# Patient Record
Sex: Male | Born: 1975 | Race: White | Hispanic: No | Marital: Single | Smoking: Current every day smoker
Health system: Southern US, Community
[De-identification: ages and names within clinical notes are randomized; demographics above are authoritative.]

---

## 2013-03-01 ENCOUNTER — Emergency Department: Payer: Self-pay | Admitting: Emergency Medicine

## 2013-04-28 ENCOUNTER — Emergency Department: Payer: Self-pay | Admitting: Emergency Medicine

## 2013-04-28 LAB — ETHANOL
Ethanol %: <0.003 % (ref 0.000–0.080)
Ethanol: <3 mg/dL

## 2013-04-28 LAB — TSH: Thyroid Stimulating Horm: 0.94 u[IU]/mL

## 2013-04-28 LAB — CBC
HCT: 48.6 % (ref 40.0–52.0)
HGB: 16.4 g/dL (ref 13.0–18.0)
MCH: 31.5 pg (ref 26.0–34.0)
MCHC: 33.7 g/dL (ref 32.0–36.0)
Platelet: 409 10*3/uL (ref 150–440)
RDW: 14.6 % — ABNORMAL HIGH (ref 11.5–14.5)

## 2013-04-28 LAB — URINALYSIS, COMPLETE
Bacteria: NONE SEEN
Blood: NEGATIVE
Leukocyte Esterase: NEGATIVE
Nitrite: NEGATIVE
Ph: 8 (ref 4.5–8.0)
Protein: NEGATIVE
Specific Gravity: 1.012 (ref 1.003–1.030)
Squamous Epithelial: <1

## 2013-04-28 LAB — DRUG SCREEN, URINE
Amphetamines, Ur Screen: NEGATIVE (ref ?–1000)
Barbiturates, Ur Screen: NEGATIVE (ref ?–200)
Benzodiazepine, Ur Scrn: NEGATIVE (ref ?–200)
Cannabinoid 50 Ng, Ur ~~LOC~~: POSITIVE (ref ?–50)
Cocaine Metabolite,Ur ~~LOC~~: POSITIVE (ref ?–300)
MDMA (Ecstasy)Ur Screen: NEGATIVE (ref ?–500)
Opiate, Ur Screen: NEGATIVE (ref ?–300)

## 2013-04-28 LAB — COMPREHENSIVE METABOLIC PANEL
Albumin: 3.6 g/dL (ref 3.4–5.0)
Alkaline Phosphatase: 163 U/L — ABNORMAL HIGH (ref 50–136)
Anion Gap: 2 — ABNORMAL LOW (ref 7–16)
Bilirubin,Total: 0.3 mg/dL (ref 0.2–1.0)
Calcium, Total: 9.4 mg/dL (ref 8.5–10.1)
Chloride: 107 mmol/L (ref 98–107)
Creatinine: 0.98 mg/dL (ref 0.60–1.30)
EGFR (African American): >60
Glucose: 108 mg/dL — ABNORMAL HIGH (ref 65–99)
Osmolality: 276 (ref 275–301)
Potassium: 4.2 mmol/L (ref 3.5–5.1)
SGOT(AST): 18 U/L (ref 15–37)
SGPT (ALT): 27 U/L (ref 12–78)
Sodium: 139 mmol/L (ref 136–145)
Total Protein: 7.6 g/dL (ref 6.4–8.2)

## 2013-04-28 LAB — ACETAMINOPHEN LEVEL: Acetaminophen: <2 ug/mL

## 2013-04-28 LAB — SALICYLATE LEVEL: Salicylates, Serum: 3.5 mg/dL — ABNORMAL HIGH

## 2013-12-22 ENCOUNTER — Ambulatory Visit: Payer: Self-pay | Admitting: Internal Medicine

## 2014-01-11 ENCOUNTER — Inpatient Hospital Stay: Payer: Self-pay | Admitting: Cardiothoracic Surgery

## 2014-01-11 LAB — COMPREHENSIVE METABOLIC PANEL
ALBUMIN: 2.6 g/dL — AB (ref 3.4–5.0)
ALK PHOS: 110 U/L
ALT: 25 U/L (ref 12–78)
ANION GAP: 5 — AB (ref 7–16)
AST: 26 U/L (ref 15–37)
BUN: 10 mg/dL (ref 7–18)
Bilirubin,Total: 0.4 mg/dL (ref 0.2–1.0)
CHLORIDE: 105 mmol/L (ref 98–107)
CREATININE: 1.05 mg/dL (ref 0.60–1.30)
Calcium, Total: 8.3 mg/dL — ABNORMAL LOW (ref 8.5–10.1)
Co2: 30 mmol/L (ref 21–32)
GLUCOSE: 102 mg/dL — AB (ref 65–99)
Osmolality: 279 (ref 275–301)
Potassium: 4 mmol/L (ref 3.5–5.1)
Sodium: 140 mmol/L (ref 136–145)
TOTAL PROTEIN: 6.4 g/dL (ref 6.4–8.2)

## 2014-01-11 LAB — CBC
HCT: 38.4 % — ABNORMAL LOW (ref 40.0–52.0)
HGB: 12.6 g/dL — AB (ref 13.0–18.0)
MCH: 31.6 pg (ref 26.0–34.0)
MCHC: 32.9 g/dL (ref 32.0–36.0)
MCV: 96 fL (ref 80–100)
Platelet: 455 10*3/uL — ABNORMAL HIGH (ref 150–440)
RBC: 4 10*6/uL — ABNORMAL LOW (ref 4.40–5.90)
RDW: 14.4 % (ref 11.5–14.5)
WBC: 12.6 10*3/uL — ABNORMAL HIGH (ref 3.8–10.6)

## 2014-01-12 LAB — DRUG SCREEN, URINE
Amphetamines, Ur Screen: NEGATIVE (ref ?–1000)
Barbiturates, Ur Screen: NEGATIVE (ref ?–200)
Benzodiazepine, Ur Scrn: NEGATIVE (ref ?–200)
CANNABINOID 50 NG, UR ~~LOC~~: NEGATIVE (ref ?–50)
Cocaine Metabolite,Ur ~~LOC~~: POSITIVE (ref ?–300)
MDMA (ECSTASY) UR SCREEN: NEGATIVE (ref ?–500)
Methadone, Ur Screen: NEGATIVE (ref ?–300)
Opiate, Ur Screen: POSITIVE (ref ?–300)
PHENCYCLIDINE (PCP) UR S: NEGATIVE (ref ?–25)
TRICYCLIC, UR SCREEN: NEGATIVE (ref ?–1000)

## 2014-01-13 LAB — CBC WITH DIFFERENTIAL/PLATELET
Basophil #: 0 10*3/uL (ref 0.0–0.1)
Basophil %: 0.2 %
EOS ABS: 0 10*3/uL (ref 0.0–0.7)
Eosinophil %: 0 %
HCT: 40.3 % (ref 40.0–52.0)
HGB: 12.7 g/dL — ABNORMAL LOW (ref 13.0–18.0)
Lymphocyte #: 0.7 10*3/uL — ABNORMAL LOW (ref 1.0–3.6)
Lymphocyte %: 3.4 %
MCH: 30.6 pg (ref 26.0–34.0)
MCHC: 31.4 g/dL — AB (ref 32.0–36.0)
MCV: 97 fL (ref 80–100)
Monocyte #: 1.2 x10 3/mm — ABNORMAL HIGH (ref 0.2–1.0)
Monocyte %: 5.9 %
Neutrophil #: 18.3 10*3/uL — ABNORMAL HIGH (ref 1.4–6.5)
Neutrophil %: 90.5 %
Platelet: 522 10*3/uL — ABNORMAL HIGH (ref 150–440)
RBC: 4.14 10*6/uL — AB (ref 4.40–5.90)
RDW: 14.5 % (ref 11.5–14.5)
WBC: 20.2 10*3/uL — AB (ref 3.8–10.6)

## 2014-01-13 LAB — BASIC METABOLIC PANEL
Anion Gap: 7 (ref 7–16)
BUN: 9 mg/dL (ref 7–18)
CALCIUM: 8.7 mg/dL (ref 8.5–10.1)
CO2: 30 mmol/L (ref 21–32)
Chloride: 103 mmol/L (ref 98–107)
Creatinine: 1.02 mg/dL (ref 0.60–1.30)
EGFR (African American): >60
EGFR (Non-African Amer.): >60
Glucose: 147 mg/dL — ABNORMAL HIGH (ref 65–99)
OSMOLALITY: 281 (ref 275–301)
Potassium: 4.3 mmol/L (ref 3.5–5.1)
Sodium: 140 mmol/L (ref 136–145)

## 2014-01-14 LAB — PATHOLOGY REPORT

## 2014-01-17 LAB — WOUND CULTURE

## 2014-01-21 ENCOUNTER — Ambulatory Visit: Payer: Self-pay | Admitting: Internal Medicine

## 2014-01-21 LAB — DRUG SCREEN, URINE
Amphetamines, Ur Screen: NEGATIVE (ref ?–1000)
BARBITURATES, UR SCREEN: NEGATIVE (ref ?–200)
BENZODIAZEPINE, UR SCRN: POSITIVE (ref ?–200)
Cannabinoid 50 Ng, Ur ~~LOC~~: NEGATIVE (ref ?–50)
Cocaine Metabolite,Ur ~~LOC~~: NEGATIVE (ref ?–300)
MDMA (Ecstasy)Ur Screen: NEGATIVE (ref ?–500)
METHADONE, UR SCREEN: NEGATIVE (ref ?–300)
Opiate, Ur Screen: POSITIVE (ref ?–300)
PHENCYCLIDINE (PCP) UR S: NEGATIVE (ref ?–25)
Tricyclic, Ur Screen: POSITIVE (ref ?–1000)

## 2014-02-21 ENCOUNTER — Ambulatory Visit: Payer: Self-pay | Admitting: Internal Medicine
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
## 2014-11-14 NOTE — Consult Note (Signed)
   Comments   Tonie GriffithStephen Mantzouris and I saw patient. UDS pending. Pt has refused to allow RN to continue removing his sutures despite the risk being explained of leaving these in place. He complains of ongoing and severe pain. Explained that Dr Harvie JuniorPhifer has left him a script for gabapentin and we discussed its use. UDS is pending and no opiates will be prescribed until this has resulted. Pt states that if he does not get pain medication today "I will get it from the streets". He has another appointment scheduled to see Dr Thelma Bargeaks tomorrow but he says he will not keep this and he will find "other doctors" who will agree with giving him pain medication.   Electronic Signatures: Bernon Arviso, Daryl EasternJoshua R (NP)  (Signed 01-Jul-15 16:30)  Authored: Palliative Care   Last Updated: 01-Jul-15 16:30 by Malachy MoanBorders, Drae Mitzel R (NP)
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
## 2016-07-29 ENCOUNTER — Emergency Department
Admission: EM | Admit: 2016-07-29 | Discharge: 2016-07-29 | Disposition: A | Discharge status: Home / Self Care | Payer: No Typology Code available for payment source | Attending: Student in an Organized Health Care Education/Training Program | Admitting: Student in an Organized Health Care Education/Training Program

## 2016-07-29 ENCOUNTER — Emergency Department: Payer: No Typology Code available for payment source

## 2016-07-29 DIAGNOSIS — X31XXXA Exposure to excessive natural cold, initial encounter: Secondary | ICD-10-CM | POA: Insufficient documentation

## 2016-07-29 DIAGNOSIS — Y999 Unspecified external cause status: Secondary | ICD-10-CM | POA: Insufficient documentation

## 2016-07-29 DIAGNOSIS — Y939 Activity, unspecified: Secondary | ICD-10-CM | POA: Insufficient documentation

## 2016-07-29 DIAGNOSIS — Y92410 Unspecified street and highway as the place of occurrence of the external cause: Secondary | ICD-10-CM | POA: Insufficient documentation

## 2016-07-29 DIAGNOSIS — S0001XA Abrasion of scalp, initial encounter: Secondary | ICD-10-CM | POA: Diagnosis not present

## 2016-07-29 DIAGNOSIS — R079 Chest pain, unspecified: Secondary | ICD-10-CM | POA: Diagnosis not present

## 2016-07-29 DIAGNOSIS — T68XXXA Hypothermia, initial encounter: Secondary | ICD-10-CM | POA: Diagnosis not present

## 2016-07-29 DIAGNOSIS — S0990XA Unspecified injury of head, initial encounter: Secondary | ICD-10-CM | POA: Diagnosis present

## 2016-07-29 LAB — BASIC METABOLIC PANEL
Anion gap: 6 (ref 5–15)
BUN: 16 mg/dL (ref 6–20)
CALCIUM: 8.7 mg/dL — AB (ref 8.9–10.3)
CO2: 29 mmol/L (ref 22–32)
CREATININE: 1 mg/dL (ref 0.61–1.24)
Chloride: 101 mmol/L (ref 101–111)
GFR calc Af Amer: >60 mL/min (ref >60)
GLUCOSE: 102 mg/dL — AB (ref 65–99)
Potassium: 3.9 mmol/L (ref 3.5–5.1)
SODIUM: 136 mmol/L (ref 135–145)

## 2016-07-29 LAB — ETHANOL: Alcohol, Ethyl (B): <5 mg/dL (ref <5)

## 2016-07-29 LAB — ACETAMINOPHEN LEVEL: Acetaminophen (Tylenol), Serum: <10 ug/mL — ABNORMAL LOW (ref 10–30)

## 2016-07-29 LAB — TROPONIN I

## 2016-07-29 MED ORDER — HYDROCODONE-ACETAMINOPHEN 5-325 MG PO TABS: 2.0000 | ORAL_TABLET | Freq: Once | ORAL | Status: DC

## 2016-07-29 MED ORDER — GABAPENTIN 100 MG PO CAPS: 200.0000 mg | ORAL_CAPSULE | Freq: Once | ORAL | Status: DC

## 2016-07-29 NOTE — ED Notes (Signed)
Pt is able to ambulate unassisted with steady gait.
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
## 2018-06-29 ENCOUNTER — Emergency Department
Admission: EM | Admit: 2018-06-29 | Discharge: 2018-06-29 | Disposition: A | Discharge status: Home / Self Care | Payer: Self-pay | Attending: Emergency Medicine | Admitting: Emergency Medicine

## 2018-06-29 ENCOUNTER — Other Ambulatory Visit: Payer: Self-pay

## 2018-06-29 ENCOUNTER — Encounter: Payer: Self-pay | Admitting: Emergency Medicine

## 2018-06-29 ENCOUNTER — Emergency Department: Payer: Self-pay

## 2018-06-29 DIAGNOSIS — Z5321 Procedure and treatment not carried out due to patient leaving prior to being seen by health care provider: Secondary | ICD-10-CM | POA: Insufficient documentation

## 2018-06-29 DIAGNOSIS — M25571 Pain in right ankle and joints of right foot: Secondary | ICD-10-CM | POA: Insufficient documentation

## 2018-06-29 NOTE — ED Notes (Signed)
Ron, PA to bedside to speak with patient at this RN's request to observe. Pt became agitated with Ron, PA and pacing the halls, attempted to redirect back to room. Pt states "yall can't hold me that's against the law", this RN and Ron, PA attempted to get patient back to room voluntarily pt refused the go. Gearldine BienenstockBrandy, RN notified and requested MD to come to lobby to evaluate patient due to flight of ideas, agitation, and pressured speech. Pt witnessed leaving lobby, this RN, Gearldine BienenstockBrandy, RN and Dr. Pershing ProudSchaevitz to lobby to attempt to find patient and speak with him, pt not visualized in parking lot or surrounding area.

## 2018-06-29 NOTE — ED Triage Notes (Signed)
Twisted R ankle yesterday, painful since.
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
## 2018-06-29 NOTE — ED Notes (Signed)
Pt c/o R ankle pain, ambulatory with slight limp.

## 2019-02-27 ENCOUNTER — Emergency Department: Payer: Self-pay

## 2019-02-27 ENCOUNTER — Emergency Department
Admission: EM | Admit: 2019-02-27 | Discharge: 2019-02-27 | Payer: Self-pay | Attending: Emergency Medicine | Admitting: Emergency Medicine

## 2019-02-27 ENCOUNTER — Encounter: Payer: Self-pay | Admitting: Emergency Medicine

## 2019-02-27 DIAGNOSIS — R410 Disorientation, unspecified: Secondary | ICD-10-CM | POA: Insufficient documentation

## 2019-02-27 DIAGNOSIS — R456 Violent behavior: Secondary | ICD-10-CM | POA: Insufficient documentation

## 2019-02-27 DIAGNOSIS — F15929 Other stimulant use, unspecified with intoxication, unspecified: Secondary | ICD-10-CM | POA: Insufficient documentation

## 2019-02-27 LAB — COMPREHENSIVE METABOLIC PANEL
ALT: 22 U/L (ref 0–44)
AST: 42 U/L — ABNORMAL HIGH (ref 15–41)
Albumin: 4.2 g/dL (ref 3.5–5.0)
Alkaline Phosphatase: 104 U/L (ref 38–126)
Anion gap: 11 (ref 5–15)
BUN: 21 mg/dL — ABNORMAL HIGH (ref 6–20)
CO2: 20 mmol/L — ABNORMAL LOW (ref 22–32)
Calcium: 8.5 mg/dL — ABNORMAL LOW (ref 8.9–10.3)
Chloride: 109 mmol/L (ref 98–111)
Creatinine, Ser: 0.96 mg/dL (ref 0.61–1.24)
GFR calc Af Amer: >60 mL/min (ref >60)
GFR calc non Af Amer: >60 mL/min (ref >60)
Glucose, Bld: 94 mg/dL (ref 70–99)
Potassium: 3.7 mmol/L (ref 3.5–5.1)
Sodium: 140 mmol/L (ref 135–145)
Total Bilirubin: 1.3 mg/dL — ABNORMAL HIGH (ref 0.3–1.2)
Total Protein: 7.2 g/dL (ref 6.5–8.1)

## 2019-02-27 LAB — CBC WITH DIFFERENTIAL/PLATELET
Abs Immature Granulocytes: 0.05 10*3/uL (ref 0.00–0.07)
Basophils Absolute: 0 10*3/uL (ref 0.0–0.1)
Basophils Relative: 0 %
Eosinophils Absolute: 0 10*3/uL (ref 0.0–0.5)
Eosinophils Relative: 0 %
HCT: 41.8 % (ref 39.0–52.0)
Hemoglobin: 13.8 g/dL (ref 13.0–17.0)
Immature Granulocytes: 0 %
Lymphocytes Relative: 3 %
Lymphs Abs: 0.5 10*3/uL — ABNORMAL LOW (ref 0.7–4.0)
MCH: 30.7 pg (ref 26.0–34.0)
MCHC: 33 g/dL (ref 30.0–36.0)
MCV: 93.1 fL (ref 80.0–100.0)
Monocytes Absolute: 0.9 10*3/uL (ref 0.1–1.0)
Monocytes Relative: 6 %
Neutro Abs: 14.2 10*3/uL — ABNORMAL HIGH (ref 1.7–7.7)
Neutrophils Relative %: 91 %
Platelets: 333 10*3/uL (ref 150–400)
RBC: 4.49 MIL/uL (ref 4.22–5.81)
RDW: 13.1 % (ref 11.5–15.5)
WBC: 15.7 10*3/uL — ABNORMAL HIGH (ref 4.0–10.5)
nRBC: 0 % (ref 0.0–0.2)

## 2019-02-27 LAB — SALICYLATE LEVEL: Salicylate Lvl: <7 mg/dL (ref 2.8–30.0)

## 2019-02-27 LAB — ACETAMINOPHEN LEVEL: Acetaminophen (Tylenol), Serum: <10 ug/mL — ABNORMAL LOW (ref 10–30)

## 2019-02-27 LAB — ETHANOL: Alcohol, Ethyl (B): <10 mg/dL (ref <10)

## 2019-02-27 MED ORDER — LORAZEPAM 2 MG/ML IJ SOLN
4.0000 mg | Freq: Once | INTRAMUSCULAR | Status: AC
  Administered 2019-02-27: 08:00:00 4 mg via INTRAMUSCULAR

## 2019-02-27 MED ORDER — LORAZEPAM 2 MG/ML IJ SOLN
INTRAMUSCULAR | Status: AC
  Administered 2019-02-27: 2 mg via INTRAVENOUS
  Filled 2019-02-27: qty 1

## 2019-02-27 MED ORDER — LORAZEPAM 2 MG/ML IJ SOLN
2.0000 mg | Freq: Once | INTRAMUSCULAR | Status: AC | PRN
  Administered 2019-02-27: 2 mg via INTRAVENOUS

## 2019-02-27 MED ORDER — LORAZEPAM 2 MG/ML IJ SOLN
4.0000 mg | Freq: Once | INTRAMUSCULAR | Status: AC
  Administered 2019-02-27: 09:00:00 4 mg via INTRAMUSCULAR
  Filled 2019-02-27: qty 2

## 2019-02-27 NOTE — ED Notes (Addendum)
Amy from [place] [employer] called [facility] Department about the patient's discharge and they will be coming here to get him. Dr. Archie Balboa aware.

## 2019-02-27 NOTE — ED Notes (Signed)
BEHAVIORAL HEALTH ROUNDING Patient sleeping: Yes.   Patient alert and oriented: eyes closed  Appears asleep Behavior appropriate: Yes.  ; If no, describe:  Nutrition and fluids offered: Yes  Toileting and hygiene offered: sleeping Sitter present: q 15 minute observations and security monitoring Law enforcement present: yes  ODS 

## 2019-02-27 NOTE — ED Notes (Signed)
Patient observed lying in bed with eyes closed  Even, unlabored respirations observed   NAD pt appears to be sleeping  I will continue to monitor along with every 15 minute visual observations and ongoing security camera monitoring    

## 2019-02-27 NOTE — ED Notes (Signed)
Patient thrashing on CT stretcher, moving out from under straps. Patient yelling out. Patient able to get his legs free from strap. Held onto CT stretcher by CT staff, deputy and EDT. 2 mg of IV ativan given by this RN per verbal MD order. Patient calmer and able to lay still for duration of CT. Even, unlabored respirations noted.

## 2019-02-27 NOTE — ED Notes (Signed)
Unable to obtain vital signs at this time due to patient behavior. This RN and Waunita Schooner, EDT at bedside assessing patients breathing and activity.

## 2019-02-27 NOTE — ED Notes (Signed)
Patient sleeping in bed at this time. Even, unlabored respirations noted. Deputy and ED staff remain at bedside at this time.
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
## 2019-02-27 NOTE — ED Notes (Signed)
Patient continuing to thrash around in bed and yell out. Patient also looking around room for things that are not there. Verbal order of 4mg  IM ativan given by Dr. Joni Fears.
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
## 2019-02-27 NOTE — ED Notes (Signed)
Call received from patient's girlfriend, Marchelle Gearing, who wants to be called at [phone] when patient is discharged.

## 2019-02-27 NOTE — ED Notes (Signed)
Patient transported to ED with this RN, Waunita Schooner, EDT and deputy.

## 2019-02-27 NOTE — ED Notes (Signed)
Patient's mother called and asked about the patient's condition. Patient's mother, Sanay Belmar, [phone], states she is about to go into work and it was her understanding that the [place] will be back to transport the patient to jail.

## 2019-02-27 NOTE — ED Notes (Signed)
Patient moved to [facility]. Report given to [facility], Therapist, sports. Dr. Jimmye Norman at bedside to assess patient. Patient released from custody and restraints removed.

## 2019-02-27 NOTE — ED Notes (Signed)
Patient resting in bed with eyes closed. Even respirations noted. Restraints remain in place due to intermittent episodes of erratic behavior. Deputy remains at bedside.

## 2019-02-27 NOTE — Discharge Instructions (Signed)
Please seek medical attention for any high fevers, chest pain, shortness of breath, change in behavior, persistent vomiting, bloody stool or any other new or concerning symptoms.  

## 2019-02-27 NOTE — ED Notes (Signed)
He is sleeping soundly   Secretary received a call from [place] requesting him to call her when he awakens  [phone]

## 2019-02-27 NOTE — ED Notes (Signed)
[employer] dept in department for pt custody. Discharge paperwork given to officers.

## 2019-02-27 NOTE — ED Notes (Signed)
BEHAVIORAL HEALTH ROUNDING Patient sleeping: No. Patient alert and oriented: yes Behavior appropriate: Yes.  ; If no, describe:  Nutrition and fluids offered: yes Toileting and hygiene offered: Yes  Sitter present: q15 minute observations and security  monitoring Law enforcement present: Yes  ODS  

## 2019-02-27 NOTE — ED Notes (Signed)
Patient remains handcuffed behind back and in shackles while in bed. Patient thrashing around and bucking up and down in bed. Patient also throwing legs over side of bed and pulling them back. Seizure pads placed on bed for patient safety. Deputies and ED staff remain at bedside.
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
## 2019-02-27 NOTE — ED Notes (Addendum)
Occasional episodes of thrashing noted. Patient handcuffs moved from behind back. Each hand cuffed to a side rail at this time. Good circulation and pulses noted. Patient remains in shackles. Deputy at bedside.

## 2019-02-27 NOTE — ED Notes (Signed)
Patient is awake and was given an ED sandwich tray and a Sprite. Patient's wrists are reddened, but no abrasions seen. Dr. Archie Balboa aware.

## 2019-07-06 ENCOUNTER — Other Ambulatory Visit: Payer: Self-pay

## 2019-07-06 ENCOUNTER — Emergency Department
Admission: EM | Admit: 2019-07-06 | Discharge: 2019-07-07 | Disposition: A | Discharge status: Home / Self Care | Payer: Self-pay | Attending: Emergency Medicine | Admitting: Emergency Medicine

## 2019-07-06 DIAGNOSIS — S01111A Laceration without foreign body of right eyelid and periocular area, initial encounter: Secondary | ICD-10-CM

## 2019-07-06 DIAGNOSIS — Y92410 Unspecified street and highway as the place of occurrence of the external cause: Secondary | ICD-10-CM | POA: Insufficient documentation

## 2019-07-06 DIAGNOSIS — R4689 Other symptoms and signs involving appearance and behavior: Secondary | ICD-10-CM

## 2019-07-06 DIAGNOSIS — F19929 Other psychoactive substance use, unspecified with intoxication, unspecified: Secondary | ICD-10-CM

## 2019-07-06 DIAGNOSIS — Y999 Unspecified external cause status: Secondary | ICD-10-CM | POA: Insufficient documentation

## 2019-07-06 DIAGNOSIS — R519 Headache, unspecified: Secondary | ICD-10-CM | POA: Insufficient documentation

## 2019-07-06 DIAGNOSIS — F17210 Nicotine dependence, cigarettes, uncomplicated: Secondary | ICD-10-CM | POA: Insufficient documentation

## 2019-07-06 DIAGNOSIS — Y93I9 Activity, other involving external motion: Secondary | ICD-10-CM | POA: Insufficient documentation

## 2019-07-06 DIAGNOSIS — Z23 Encounter for immunization: Secondary | ICD-10-CM | POA: Insufficient documentation

## 2019-07-06 DIAGNOSIS — R0789 Other chest pain: Secondary | ICD-10-CM | POA: Insufficient documentation

## 2019-07-06 MED ORDER — OXYCODONE-ACETAMINOPHEN 5-325 MG PO TABS
1.0000 | ORAL_TABLET | Freq: Once | ORAL | Status: AC
  Administered 2019-07-07: 1 via ORAL
  Filled 2019-07-06: qty 1

## 2019-07-06 MED ORDER — TETANUS-DIPHTH-ACELL PERTUSSIS 5-2.5-18.5 LF-MCG/0.5 IM SUSP
0.5000 mL | Freq: Once | INTRAMUSCULAR | Status: AC
  Administered 2019-07-07: 0.5 mL via INTRAMUSCULAR
  Filled 2019-07-06: qty 0.5

## 2019-07-06 NOTE — ED Notes (Signed)
Patient walking around room even after being asked by several staff members to stay on stretcher.

## 2019-07-06 NOTE — ED Notes (Signed)
Pt hyperverbal- admits to cocaine use today.
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
## 2019-07-07 ENCOUNTER — Emergency Department: Payer: Self-pay

## 2019-07-07 ENCOUNTER — Encounter: Payer: Self-pay | Admitting: Radiology

## 2019-07-07 LAB — COMPREHENSIVE METABOLIC PANEL
ALT: 112 U/L — ABNORMAL HIGH (ref 0–44)
AST: 70 U/L — ABNORMAL HIGH (ref 15–41)
Albumin: 4.1 g/dL (ref 3.5–5.0)
Alkaline Phosphatase: 80 U/L (ref 38–126)
Anion gap: 12 (ref 5–15)
BUN: 23 mg/dL — ABNORMAL HIGH (ref 6–20)
CO2: 22 mmol/L (ref 22–32)
Calcium: 8.4 mg/dL — ABNORMAL LOW (ref 8.9–10.3)
Chloride: 105 mmol/L (ref 98–111)
Creatinine, Ser: 1.16 mg/dL (ref 0.61–1.24)
GFR calc Af Amer: >60 mL/min (ref >60)
GFR calc non Af Amer: >60 mL/min (ref >60)
Glucose, Bld: 99 mg/dL (ref 70–99)
Potassium: 3.9 mmol/L (ref 3.5–5.1)
Sodium: 139 mmol/L (ref 135–145)
Total Bilirubin: 1.2 mg/dL (ref 0.3–1.2)
Total Protein: 7.7 g/dL (ref 6.5–8.1)

## 2019-07-07 LAB — CBC
HCT: 38.8 % — ABNORMAL LOW (ref 39.0–52.0)
Hemoglobin: 13.3 g/dL (ref 13.0–17.0)
MCH: 30.9 pg (ref 26.0–34.0)
MCHC: 34.3 g/dL (ref 30.0–36.0)
MCV: 90.2 fL (ref 80.0–100.0)
Platelets: 384 10*3/uL (ref 150–400)
RBC: 4.3 MIL/uL (ref 4.22–5.81)
RDW: 12.9 % (ref 11.5–15.5)
WBC: 13 10*3/uL — ABNORMAL HIGH (ref 4.0–10.5)
nRBC: 0 % (ref 0.0–0.2)

## 2019-07-07 LAB — ETHANOL: Alcohol, Ethyl (B): <10 mg/dL (ref <10)

## 2019-07-07 MED ORDER — LORAZEPAM 2 MG/ML IJ SOLN
2.0000 mg | Freq: Once | INTRAMUSCULAR | Status: AC
  Administered 2019-07-07: 2 mg via INTRAVENOUS
  Filled 2019-07-07: qty 1

## 2019-07-07 MED ORDER — DROPERIDOL 2.5 MG/ML IJ SOLN
2.5000 mg | Freq: Once | INTRAMUSCULAR | Status: AC
  Administered 2019-07-07: 2.5 mg via INTRAVENOUS

## 2019-07-07 MED ORDER — HALOPERIDOL LACTATE 5 MG/ML IJ SOLN
INTRAMUSCULAR | Status: AC
  Filled 2019-07-07: qty 1

## 2019-07-07 MED ORDER — AMMONIA AROMATIC IN INHA: 0.3000 mL | Freq: Once | RESPIRATORY_TRACT | Status: DC

## 2019-07-07 MED ORDER — HALOPERIDOL LACTATE 5 MG/ML IJ SOLN
5.0000 mg | Freq: Once | INTRAMUSCULAR | Status: AC
  Administered 2019-07-07: 5 mg via INTRAMUSCULAR

## 2019-07-07 MED ORDER — LORAZEPAM 2 MG/ML IJ SOLN
INTRAMUSCULAR | Status: AC
  Administered 2019-07-07: 2 mg via INTRAVENOUS
  Filled 2019-07-07: qty 1

## 2019-07-07 MED ORDER — DIPHENHYDRAMINE HCL 50 MG/ML IJ SOLN: 50.0000 mg | Freq: Once | INTRAMUSCULAR | Status: AC

## 2019-07-07 MED ORDER — DIPHENHYDRAMINE HCL 50 MG/ML IJ SOLN
INTRAMUSCULAR | Status: AC
  Administered 2019-07-07: 50 mg via INTRAVENOUS
  Filled 2019-07-07: qty 1

## 2019-07-07 MED ORDER — AMMONIA AROMATIC IN INHA
RESPIRATORY_TRACT | Status: AC
  Administered 2019-07-07: 10:00:00 via RESPIRATORY_TRACT
  Filled 2019-07-07: qty 10

## 2019-07-07 MED ORDER — IOHEXOL 300 MG/ML  SOLN
100.0000 mL | Freq: Once | INTRAMUSCULAR | Status: AC | PRN
  Administered 2019-07-07: 100 mL via INTRAVENOUS

## 2019-07-07 MED ORDER — LORAZEPAM 2 MG/ML IJ SOLN
INTRAMUSCULAR | Status: AC
  Filled 2019-07-07: qty 1

## 2019-07-07 MED ORDER — LORAZEPAM 2 MG/ML IJ SOLN: 2.0000 mg | Freq: Once | INTRAMUSCULAR | Status: AC

## 2019-07-07 MED ORDER — DROPERIDOL 2.5 MG/ML IJ SOLN
5.0000 mg | Freq: Once | INTRAMUSCULAR | Status: AC
  Administered 2019-07-07: 5 mg via INTRAVENOUS

## 2019-07-07 MED ORDER — DIPHENHYDRAMINE HCL 50 MG/ML IJ SOLN
INTRAMUSCULAR | Status: AC
  Filled 2019-07-07: qty 1
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
## 2019-07-07 NOTE — ED Notes (Signed)
IVC for safety/ Plan to re-evaluate and D/C when sober

## 2019-07-07 NOTE — ED Notes (Signed)
Continues to rest quietly, no acute distress noted. 
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
## 2019-07-07 NOTE — ED Notes (Signed)
Esign not working at this time.Pt verbalized discharge instructions and has no questions at this time. Pt's mom called and is on her way. Pt to wait in lobby.
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
## 2019-07-07 NOTE — ED Notes (Signed)
Returned from [place].  Patient is resting qith eyes closed at this time, no acute distress noted.

## 2019-07-07 NOTE — ED Notes (Signed)
Pt ambulatory to wheelchair. 

## 2019-07-07 NOTE — ED Notes (Signed)
Patient to CT.

## 2019-07-07 NOTE — ED Notes (Signed)
Patient very agitated and verbally and physically aggressive towards staff.  1 medication given.  While attempting to give 2nd and 3rd medications patient became more aggressive and attempting to move around and this RN felt unsafe for self and patient to administer medication.  Was placing medication on shelf in room to be able to calms patient when patient grabbed syringe and started waving it around and then uncapped medication.  [employer] Deputy in room and holding patient so he does not injury self or staff.  Medication wasted from syringe and syringe disposed of.

## 2019-07-07 NOTE — ED Notes (Signed)
Pt taken to waiting room.
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
## 2021-03-12 ENCOUNTER — Other Ambulatory Visit: Payer: Self-pay

## 2021-03-12 ENCOUNTER — Emergency Department
Admission: EM | Admit: 2021-03-12 | Discharge: 2021-03-12 | Disposition: A | Discharge status: Home / Self Care | Payer: Self-pay | Attending: Emergency Medicine | Admitting: Emergency Medicine

## 2021-03-12 DIAGNOSIS — R4182 Altered mental status, unspecified: Secondary | ICD-10-CM | POA: Insufficient documentation

## 2021-03-12 DIAGNOSIS — T50901A Poisoning by unspecified drugs, medicaments and biological substances, accidental (unintentional), initial encounter: Secondary | ICD-10-CM | POA: Insufficient documentation

## 2021-03-12 DIAGNOSIS — F1910 Other psychoactive substance abuse, uncomplicated: Secondary | ICD-10-CM | POA: Insufficient documentation

## 2021-03-12 DIAGNOSIS — F17200 Nicotine dependence, unspecified, uncomplicated: Secondary | ICD-10-CM | POA: Insufficient documentation

## 2021-03-12 MED ORDER — NALOXONE HCL 2 MG/2ML IJ SOSY
PREFILLED_SYRINGE | INTRAMUSCULAR | Status: AC
  Administered 2021-03-12: 0.4 mg via INTRAVENOUS
  Filled 2021-03-12: qty 2

## 2021-03-12 MED ORDER — NALOXONE HCL 2 MG/2ML IJ SOSY: 0.4000 mg | PREFILLED_SYRINGE | Freq: Once | INTRAMUSCULAR | Status: AC

## 2021-03-12 NOTE — ED Notes (Signed)
0.5 of Narcan admin now
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
## 2021-03-12 NOTE — ED Provider Notes (Signed)
[facility] Emergency Department Provider Note  ____________________________________________   I have reviewed the triage vital signs and the nursing notes.   HISTORY  Chief Complaint Drug Overdose   History limited by and level 5 caveat due to: AMS   HPI Anthony [name] is a 45 y.o. male who presents to the emergency department today after apparent drug overdose. Per report there was concern for heroin fentanyl and meth overdose. Apparently the patient was having erratic behavior. EMS administered haldol, versed and narcan. Patient himself cannot give any history.    Records reviewed. Per medical record review patient has a history of drug abuse.   There are no problems to display for this patient.   No past surgical history on file.  Prior to Admission medications   Not on File    Allergies Patient has no known allergies.  No family history on file.  Social History Social History   Tobacco Use   Smoking status: Every Day  Substance Use Topics   Alcohol use: Yes    Comment: wine today   Drug use: Yes    Types: Methamphetamines    Unable to obtain reliable ROS.   ____________________________________________   PHYSICAL EXAM:  VITAL SIGNS: ED Triage Vitals  Enc Vitals Group     BP 03/12/21 1444 (!) 86/50     Pulse Rate 03/12/21 1444 79     Resp 03/12/21 1444 10     Temp 03/12/21 1444 97.7 F (36.5 C)     Temp Source 03/12/21 1444 Axillary     SpO2 03/12/21 1444 95 %     Weight --      Height 03/12/21 1445 6' (1.829 m)    Constitutional: Somnolent. Responds to painful stimuli.  Eyes: Conjunctivae are normal.  ENT      Head: Normocephalic and atraumatic.      Nose: No congestion/rhinnorhea.      Mouth/Throat: Mucous membranes are moist.      Neck: No stridor. Hematological/Lymphatic/Immunilogical: No cervical lymphadenopathy. Cardiovascular: Normal rate, regular rhythm.  No murmurs, rubs, or gallops.  Respiratory: Normal  respiratory effort without tachypnea nor retractions. Breath sounds are clear and equal bilaterally. No wheezes/rales/rhonchi. Gastrointestinal: Soft and non tender. No rebound. No guarding.  Genitourinary: Deferred Musculoskeletal: Normal range of motion in all extremities. No lower extremity edema. Neurologic: Somnolent. Responds to painful stimuli. Moving all extremities.  Skin:  Skin is warm, dry and intact. No rash noted.  ____________________________________________    LABS (pertinent positives/negatives)  None  ____________________________________________   EKG  IPhineas Semen, attending physician, personally viewed and interpreted this EKG  EKG Time: 1441 Rate: 81 Rhythm: sinus rhythm Axis: normal Intervals: qtc 501 QRS: incomplete RBBB ST changes: no st elevation Impression: abnormal ekg  ____________________________________________    RADIOLOGY  None  ____________________________________________   PROCEDURES  Procedures  ____________________________________________   INITIAL IMPRESSION / ASSESSMENT AND PLAN / ED COURSE  Pertinent labs & imaging results that were available during my care of the patient were reviewed by me and considered in my medical decision making (see chart for details).  Patient presented to the emergency department today after concerns for overdose on illegal substance.  Was concerns for multiple overdose.  Patient was given Haldol and Versed by EMS.  On my initial exam patient was quite somnolent but would respond to painful stimuli.  He was observed in the emergency department for a number of hours and had gradual improvement of his mental status.  After  a few hours he was lucid and spontaneously awake.  While we are waiting for patient become more lucid his mother did call into the emergency department and mention to concern for possible suicidal thoughts.  When I discussed patient when he was awake and alert he denied any  suicidal ideation or depression.  He stated his mom likely said that so that we would keep him here because of the drug issues.  However given concern for possible SI will have psychiatry team evaluate.  Psychiatric team evaluated. At this time they do not feel patient is a danger to himself. Will plan on discharging. Will give [facility] and [facility] follow up information. ____________________________________________   FINAL CLINICAL IMPRESSION(S) / ED DIAGNOSES  Final diagnoses:  Polysubstance abuse (HCC)     Note: This dictation was prepared with Dragon dictation. Any transcriptional errors that result from this process are unintentional     Phineas Semen, MD 03/12/21 2317

## 2021-03-12 NOTE — ED Notes (Signed)
Pt is now scratching all over, pulled off his [place] and cardiac lead sand wont sit still. RR has improved.

## 2021-03-12 NOTE — ED Notes (Signed)
Patient's mother, Levii Hairfield reports that patient has been threatening suicide for several days. Ms Hold can be reached at [phone].

## 2021-03-12 NOTE — ED Notes (Signed)
Patient sleeping

## 2021-03-12 NOTE — ED Notes (Signed)
Patient denied SI to RN and MD Derrill Kay.

## 2021-03-12 NOTE — ED Notes (Signed)
Reviewed discharge instructions and follow-up care with patient. Patient again denied SI. Patient verbalized understanding of all information reviewed. Patient stable, with no distress noted at this time.

## 2021-03-12 NOTE — Discharge Instructions (Signed)
Please seek medical attention and help for any thoughts about wanting to harm yourself, harm others, any concerning change in behavior, severe depression, inappropriate drug use or any other new or concerning symptoms. ° °

## 2021-03-12 NOTE — ED Notes (Signed)
Patient up, awake, and alert. MD Derrill Kay informed. MD to bedside.

## 2021-03-12 NOTE — ED Notes (Signed)
Went into alarm sounding. Pt had ripped all of his cardiac leads and CO2 monitor off. I waited until he stopped stirring and replaced cardiac leads.

## 2021-05-14 ENCOUNTER — Emergency Department (HOSPITAL_COMMUNITY)
Admission: EM | Admit: 2021-05-14 | Discharge: 2021-05-14 | Attending: Emergency Medicine | Admitting: Emergency Medicine

## 2021-05-14 ENCOUNTER — Other Ambulatory Visit: Payer: Self-pay

## 2021-05-14 DIAGNOSIS — F1510 Other stimulant abuse, uncomplicated: Secondary | ICD-10-CM | POA: Insufficient documentation

## 2021-05-14 DIAGNOSIS — F17200 Nicotine dependence, unspecified, uncomplicated: Secondary | ICD-10-CM | POA: Insufficient documentation

## 2021-05-14 DIAGNOSIS — F1910 Other psychoactive substance abuse, uncomplicated: Secondary | ICD-10-CM

## 2021-05-14 MED ORDER — LORAZEPAM 1 MG PO TABS
2.0000 mg | ORAL_TABLET | Freq: Once | ORAL | Status: AC
  Administered 2021-05-14: 2 mg via ORAL
  Filled 2021-05-14: qty 2
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
## 2021-12-13 ENCOUNTER — Emergency Department
Admission: EM | Admit: 2021-12-13 | Discharge: 2021-12-13 | Discharge status: Against Medical Advice | Payer: PRIVATE HEALTH INSURANCE | Attending: Emergency Medicine | Admitting: Emergency Medicine

## 2021-12-13 ENCOUNTER — Other Ambulatory Visit: Payer: Self-pay

## 2021-12-13 ENCOUNTER — Encounter: Payer: Self-pay | Admitting: Emergency Medicine

## 2021-12-13 ENCOUNTER — Emergency Department
Admission: EM | Admit: 2021-12-13 | Discharge: 2021-12-13 | Disposition: A | Discharge status: Home / Self Care | Payer: Self-pay | Attending: Emergency Medicine | Admitting: Emergency Medicine

## 2021-12-13 ENCOUNTER — Emergency Department: Payer: Self-pay

## 2021-12-13 ENCOUNTER — Emergency Department: Payer: PRIVATE HEALTH INSURANCE

## 2021-12-13 DIAGNOSIS — R0782 Intercostal pain: Secondary | ICD-10-CM | POA: Insufficient documentation

## 2021-12-13 DIAGNOSIS — I959 Hypotension, unspecified: Secondary | ICD-10-CM | POA: Diagnosis not present

## 2021-12-13 DIAGNOSIS — J189 Pneumonia, unspecified organism: Secondary | ICD-10-CM

## 2021-12-13 DIAGNOSIS — R0781 Pleurodynia: Secondary | ICD-10-CM | POA: Diagnosis not present

## 2021-12-13 DIAGNOSIS — R0602 Shortness of breath: Secondary | ICD-10-CM | POA: Insufficient documentation

## 2021-12-13 DIAGNOSIS — Z5321 Procedure and treatment not carried out due to patient leaving prior to being seen by health care provider: Secondary | ICD-10-CM | POA: Insufficient documentation

## 2021-12-13 DIAGNOSIS — R52 Pain, unspecified: Secondary | ICD-10-CM | POA: Diagnosis not present

## 2021-12-13 DIAGNOSIS — R9431 Abnormal electrocardiogram [ECG] [EKG]: Secondary | ICD-10-CM | POA: Insufficient documentation

## 2021-12-13 DIAGNOSIS — Z20822 Contact with and (suspected) exposure to covid-19: Secondary | ICD-10-CM | POA: Insufficient documentation

## 2021-12-13 DIAGNOSIS — J168 Pneumonia due to other specified infectious organisms: Secondary | ICD-10-CM | POA: Diagnosis not present

## 2021-12-13 DIAGNOSIS — R0789 Other chest pain: Secondary | ICD-10-CM | POA: Diagnosis not present

## 2021-12-13 DIAGNOSIS — J181 Lobar pneumonia, unspecified organism: Secondary | ICD-10-CM | POA: Insufficient documentation

## 2021-12-13 LAB — COMPREHENSIVE METABOLIC PANEL
ALT: 43 U/L (ref 0–44)
AST: 90 U/L — ABNORMAL HIGH (ref 15–41)
Albumin: 4.3 g/dL (ref 3.5–5.0)
Alkaline Phosphatase: 107 U/L (ref 38–126)
Anion gap: 13 (ref 5–15)
BUN: 68 mg/dL — ABNORMAL HIGH (ref 6–20)
CO2: 23 mmol/L (ref 22–32)
Calcium: 8.9 mg/dL (ref 8.9–10.3)
Chloride: 93 mmol/L — ABNORMAL LOW (ref 98–111)
Creatinine, Ser: 1.49 mg/dL — ABNORMAL HIGH (ref 0.61–1.24)
GFR, Estimated: 58 mL/min — ABNORMAL LOW (ref >60)
Glucose, Bld: 97 mg/dL (ref 70–99)
Potassium: 4.2 mmol/L (ref 3.5–5.1)
Sodium: 129 mmol/L — ABNORMAL LOW (ref 135–145)
Total Bilirubin: 1.2 mg/dL (ref 0.3–1.2)
Total Protein: 9.1 g/dL — ABNORMAL HIGH (ref 6.5–8.1)

## 2021-12-13 LAB — CBC WITH DIFFERENTIAL/PLATELET
Abs Immature Granulocytes: 0.06 10*3/uL (ref 0.00–0.07)
Basophils Absolute: 0 10*3/uL (ref 0.0–0.1)
Basophils Relative: 0 %
Eosinophils Absolute: 0 10*3/uL (ref 0.0–0.5)
Eosinophils Relative: 0 %
HCT: 39.5 % (ref 39.0–52.0)
Hemoglobin: 13.1 g/dL (ref 13.0–17.0)
Immature Granulocytes: 1 %
Lymphocytes Relative: 10 %
Lymphs Abs: 1.3 10*3/uL (ref 0.7–4.0)
MCH: 28.9 pg (ref 26.0–34.0)
MCHC: 33.2 g/dL (ref 30.0–36.0)
MCV: 87.2 fL (ref 80.0–100.0)
Monocytes Absolute: 1 10*3/uL (ref 0.1–1.0)
Monocytes Relative: 8 %
Neutro Abs: 10.4 10*3/uL — ABNORMAL HIGH (ref 1.7–7.7)
Neutrophils Relative %: 81 %
Platelets: 298 10*3/uL (ref 150–400)
RBC: 4.53 MIL/uL (ref 4.22–5.81)
RDW: 13.6 % (ref 11.5–15.5)
WBC: 12.8 10*3/uL — ABNORMAL HIGH (ref 4.0–10.5)
nRBC: 0 % (ref 0.0–0.2)

## 2021-12-13 LAB — APTT: aPTT: 32 seconds (ref 24–36)

## 2021-12-13 LAB — URINE DRUG SCREEN, QUALITATIVE (ARMC ONLY)
Amphetamines, Ur Screen: POSITIVE — AB
Barbiturates, Ur Screen: NOT DETECTED
Benzodiazepine, Ur Scrn: NOT DETECTED
Cannabinoid 50 Ng, Ur ~~LOC~~: NOT DETECTED
Cocaine Metabolite,Ur ~~LOC~~: NOT DETECTED
MDMA (Ecstasy)Ur Screen: NOT DETECTED
Methadone Scn, Ur: NOT DETECTED
Opiate, Ur Screen: POSITIVE — AB
Phencyclidine (PCP) Ur S: NOT DETECTED
Tricyclic, Ur Screen: NOT DETECTED

## 2021-12-13 LAB — RESP PANEL BY RT-PCR (FLU A&B, COVID) ARPGX2
Influenza A by PCR: NEGATIVE
Influenza B by PCR: NEGATIVE
SARS Coronavirus 2 by RT PCR: NEGATIVE

## 2021-12-13 LAB — URINALYSIS, COMPLETE (UACMP) WITH MICROSCOPIC
Bacteria, UA: NONE SEEN
Bilirubin Urine: NEGATIVE
Glucose, UA: NEGATIVE mg/dL
Hgb urine dipstick: NEGATIVE
Ketones, ur: 5 mg/dL — AB
Leukocytes,Ua: NEGATIVE
Nitrite: NEGATIVE
Protein, ur: NEGATIVE mg/dL
Specific Gravity, Urine: 1.019 (ref 1.005–1.030)
pH: 5 (ref 5.0–8.0)

## 2021-12-13 LAB — LACTIC ACID, PLASMA: Lactic Acid, Venous: 2.4 mmol/L (ref 0.5–1.9)

## 2021-12-13 LAB — MAGNESIUM: Magnesium: 2.3 mg/dL (ref 1.7–2.4)

## 2021-12-13 LAB — PROTIME-INR
INR: 1.1 (ref 0.8–1.2)
Prothrombin Time: 13.9 seconds (ref 11.4–15.2)

## 2021-12-13 LAB — ETHANOL: Alcohol, Ethyl (B): <10 mg/dL (ref <10)

## 2021-12-13 LAB — PROCALCITONIN: Procalcitonin: 51.29 ng/mL

## 2021-12-13 LAB — TROPONIN I (HIGH SENSITIVITY): Troponin I (High Sensitivity): 33 ng/L — ABNORMAL HIGH (ref <18)

## 2021-12-13 MED ORDER — LACTATED RINGERS IV BOLUS (SEPSIS)
500.0000 mL | Freq: Once | INTRAVENOUS | Status: AC
  Administered 2021-12-13: 500 mL via INTRAVENOUS

## 2021-12-13 MED ORDER — DOXYCYCLINE HYCLATE 100 MG PO CAPS: 100.0000 mg | ORAL_CAPSULE | Freq: Two times a day (BID) | ORAL | 0 refills | Status: AC

## 2021-12-13 MED ORDER — SODIUM CHLORIDE 0.9 % IV SOLN
100.0000 mg | Freq: Once | INTRAVENOUS | Status: DC
  Filled 2021-12-13: qty 100

## 2021-12-13 MED ORDER — VANCOMYCIN HCL 1500 MG/300ML IV SOLN
1500.0000 mg | Freq: Once | INTRAVENOUS | Status: DC
  Filled 2021-12-13: qty 300

## 2021-12-13 MED ORDER — CEFDINIR 300 MG PO CAPS: 300.0000 mg | ORAL_CAPSULE | Freq: Two times a day (BID) | ORAL | 0 refills | Status: AC

## 2021-12-13 MED ORDER — SODIUM CHLORIDE 0.9 % IV SOLN: 500.0000 mg | INTRAVENOUS | Status: DC

## 2021-12-13 MED ORDER — VANCOMYCIN HCL 1.5 G IV SOLR
1500.0000 mg | Freq: Once | INTRAVENOUS | Status: AC
  Administered 2021-12-13: 1500 mg via INTRAVENOUS
  Filled 2021-12-13: qty 1500

## 2021-12-13 MED ORDER — KETOROLAC TROMETHAMINE 30 MG/ML IJ SOLN
30.0000 mg | Freq: Once | INTRAMUSCULAR | Status: AC
Start: 2021-12-13 — End: 2021-12-13
  Administered 2021-12-13: 30 mg via INTRAVENOUS
  Filled 2021-12-13: qty 1

## 2021-12-13 MED ORDER — LACTATED RINGERS IV SOLN: INTRAVENOUS | Status: DC

## 2021-12-13 MED ORDER — SODIUM CHLORIDE 0.9 % IV SOLN: 2.0000 g | INTRAVENOUS | Status: DC

## 2021-12-13 MED ORDER — SODIUM CHLORIDE 0.9 % IV SOLN
2.0000 g | Freq: Once | INTRAVENOUS | Status: AC
  Administered 2021-12-13: 2 g via INTRAVENOUS
  Filled 2021-12-13: qty 20

## 2021-12-13 MED ORDER — LACTATED RINGERS IV BOLUS (SEPSIS)
1000.0000 mL | Freq: Once | INTRAVENOUS | Status: AC
  Administered 2021-12-13: 1000 mL via INTRAVENOUS

## 2021-12-13 NOTE — Sepsis Progress Note (Signed)
Following per sepsis protocol   
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
## 2021-12-13 NOTE — ED Notes (Signed)
No answer when called several times from lobby 
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
## 2021-12-13 NOTE — Discharge Instructions (Addendum)
Pt leaving ama refused to

## 2021-12-13 NOTE — ED Provider Notes (Signed)
Patient pulled out his own IV, states he has to leave right away.  I evaluated him.  He does appear to be hemodynamically stable.  Had a long discussion with him stating that he appears septic from a pneumonia and that he is at significant risk of decompensation including sepsis, respiratory failure, cardiac arrest, death, and severe pain/disability if he were to leave.  He states he has to go take care of his son.  He understands the risks of doing so.  I attempted to have him arrange childcare or bring his son here, states he will try to return if he is able to.  In the interim, will sign him out AMA but also give a prescription for cefdinir and doxycycline given his QT prolongation.  Encouraged him to return immediately once able.   Duffy Bruce, MD 12/13/21 [phone]
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
## 2021-12-13 NOTE — Progress Notes (Signed)
PHARMACY -  BRIEF ANTIBIOTIC NOTE   Pharmacy has received consult(s) for vancomycin from an ED provider.  The patient's profile has been reviewed for ht/wt/allergies/indication/available labs.    One time order(s) placed for vancomycin 1500 mg IV x 1  Further antibiotics/pharmacy consults should be ordered by admitting physician if indicated.                       Thank you, Lowella Bandy 12/13/2021  7:38 AM

## 2021-12-14 LAB — URINE CULTURE: Culture: NO GROWTH

## 2021-12-18 LAB — CULTURE, BLOOD (ROUTINE X 2)
Culture: NO GROWTH
Culture: NO GROWTH

## 2021-12-26 DIAGNOSIS — Z131 Encounter for screening for diabetes mellitus: Secondary | ICD-10-CM | POA: Diagnosis not present

## 2021-12-26 DIAGNOSIS — R69 Illness, unspecified: Secondary | ICD-10-CM | POA: Diagnosis not present

## 2021-12-26 DIAGNOSIS — Z114 Encounter for screening for human immunodeficiency virus [HIV]: Secondary | ICD-10-CM | POA: Diagnosis not present

## 2021-12-26 DIAGNOSIS — Z1159 Encounter for screening for other viral diseases: Secondary | ICD-10-CM | POA: Diagnosis not present

## 2021-12-26 DIAGNOSIS — Z7189 Other specified counseling: Secondary | ICD-10-CM | POA: Diagnosis not present

## 2021-12-26 DIAGNOSIS — Z1389 Encounter for screening for other disorder: Secondary | ICD-10-CM | POA: Diagnosis not present

## 2021-12-26 DIAGNOSIS — Z1329 Encounter for screening for other suspected endocrine disorder: Secondary | ICD-10-CM | POA: Diagnosis not present

## 2021-12-29 ENCOUNTER — Telehealth: Payer: Self-pay

## 2021-12-29 NOTE — Telephone Encounter (Signed)
Calling pt per referral by [facility]. Provider is requesting that pt be treated by [facility].  They do not carry Bicillin at their facility.  02/21/2013 TRUST in state labs = non-reactive This RN did not see any RPR labs in Epic.  Discussed with Wendi Snipes, FNP. Records faxed to [facility] by Kateri Plummer, RN  Phone call to pt at [phone]. Pt confirmed identity.  Pt states he has never been treated for syphilis before and never been told he has syphilis before. Pt states he has not been tested for syphilis in the last year, not sure when last time it was completed.  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  Was at plasma center maybe about 1 month ago per pt (around 11/22/21). [facility] on [address] in [place].  Christean Leaf, DIS has already scheduled pt for provider appt for 12/30/21.

## 2021-12-30 ENCOUNTER — Encounter: Payer: Self-pay | Admitting: Family Medicine

## 2021-12-30 ENCOUNTER — Ambulatory Visit: Payer: Self-pay | Admitting: Family Medicine

## 2021-12-30 DIAGNOSIS — Z113 Encounter for screening for infections with a predominantly sexual mode of transmission: Secondary | ICD-10-CM

## 2021-12-30 DIAGNOSIS — A539 Syphilis, unspecified: Secondary | ICD-10-CM

## 2021-12-30 DIAGNOSIS — F1110 Opioid abuse, uncomplicated: Secondary | ICD-10-CM

## 2021-12-30 DIAGNOSIS — Z202 Contact with and (suspected) exposure to infections with a predominantly sexual mode of transmission: Secondary | ICD-10-CM

## 2021-12-30 LAB — HM HIV SCREENING LAB: HM HIV Screening: POSITIVE

## 2021-12-30 LAB — HEPATITIS B SURFACE ANTIGEN: Hepatitis B Surface Ag: NONREACTIVE

## 2021-12-30 MED ORDER — METRONIDAZOLE 500 MG PO TABS: 500.0000 mg | ORAL_TABLET | Freq: Two times a day (BID) | ORAL | 0 refills | Status: AC

## 2021-12-30 MED ORDER — METRONIDAZOLE 500 MG PO TABS: 500.0000 mg | ORAL_TABLET | Freq: Two times a day (BID) | ORAL | 0 refills | Status: DC

## 2021-12-30 MED ORDER — PENICILLIN G BENZATHINE 1200000 UNIT/2ML IM SUSY
2.4000 10*6.[IU] | PREFILLED_SYRINGE | Freq: Once | INTRAMUSCULAR | Status: AC
  Administered 2021-12-30: 2.4 10*6.[IU] via INTRAMUSCULAR
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
## 2021-12-30 NOTE — Progress Notes (Signed)
Patient here for STD screening. Patient given narcan. Patient given metro for 7 days. Patient given Bicillin, per providers orders. Patient declines allergies. Patient given substance abuse organization list. Patient has in appt for next Friday

## 2022-01-02 DIAGNOSIS — F1110 Opioid abuse, uncomplicated: Secondary | ICD-10-CM | POA: Insufficient documentation

## 2022-01-02 DIAGNOSIS — A539 Syphilis, unspecified: Secondary | ICD-10-CM | POA: Insufficient documentation

## 2022-01-04 NOTE — Telephone Encounter (Addendum)
Pt has order for Bicillin injections x 3 per Junious Dresser, FNP:  #1= completed 12/30/21 #2= (appt scheduled for 01/06/22) - 2nd set of injections completed 01/06/22 #3= (appt for 01/13/22)

## 2022-01-06 ENCOUNTER — Ambulatory Visit: Payer: Self-pay | Admitting: Nurse Practitioner

## 2022-01-06 DIAGNOSIS — Z717 Human immunodeficiency virus [HIV] counseling: Secondary | ICD-10-CM

## 2022-01-06 DIAGNOSIS — A539 Syphilis, unspecified: Secondary | ICD-10-CM

## 2022-01-06 MED ORDER — PENICILLIN G BENZATHINE 1200000 UNIT/2ML IM SUSY
2.4000 10*6.[IU] | PREFILLED_SYRINGE | INTRAMUSCULAR | Status: AC
  Administered 2022-01-06: 2.4 10*6.[IU] via INTRAMUSCULAR

## 2022-01-06 NOTE — Progress Notes (Signed)
Here to speak with provider. Bicillin 2nd dose given today per provider orders and patient to return next Friday for 3rd dose. Client stayed for observation for 15 minutes without any problems.Burt Knack, RN

## 2022-01-08 ENCOUNTER — Encounter: Payer: Self-pay | Admitting: Nurse Practitioner

## 2022-01-08 NOTE — Progress Notes (Signed)
46 year old male in clinic today for treatment of Syphilis.  Patient reported to clinic on 12/30/21 for STD screening.  Patient was referred by [facility] d/t + RPR from plasma center and uncertain HIV result.  Reports hx of HCV.   At 12/30/21 patient was given Bicillin 2.4 million units IM and will need an additional two doses.  Second dose provided today, next dose needed on 01/13/22.  12/30/21 HIV results were positive.  Patient provided with positive HIV test results and also provided with counseling services, control measures, and link into care.  Patient informed to use condoms with all sex, not to donate plasma or blood, no sharing of needles, and to inform all current sex partners of HIV status.  DIS has been in contact with patient and patient given resource information regarding HIV case management.   1. Syphilis - penicillin g benzathine (BICILLIN LA) 1200000 UNIT/2ML injection 2.4 Million Units

## 2022-01-10 ENCOUNTER — Encounter: Payer: Self-pay | Admitting: Nurse Practitioner

## 2022-01-10 DIAGNOSIS — Z8619 Personal history of other infectious and parasitic diseases: Secondary | ICD-10-CM | POA: Insufficient documentation

## 2022-01-10 DIAGNOSIS — Z21 Asymptomatic human immunodeficiency virus [HIV] infection status: Secondary | ICD-10-CM | POA: Insufficient documentation

## 2022-01-10 DIAGNOSIS — B20 Human immunodeficiency virus [HIV] disease: Secondary | ICD-10-CM | POA: Insufficient documentation

## 2022-01-13 ENCOUNTER — Encounter: Payer: Self-pay | Admitting: Nurse Practitioner

## 2022-01-13 ENCOUNTER — Ambulatory Visit: Payer: Self-pay | Admitting: Nurse Practitioner

## 2022-01-13 DIAGNOSIS — A539 Syphilis, unspecified: Secondary | ICD-10-CM
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
## 2023-02-02 DIAGNOSIS — F1120 Opioid dependence, uncomplicated: Secondary | ICD-10-CM | POA: Diagnosis not present

## 2023-02-02 DIAGNOSIS — F1520 Other stimulant dependence, uncomplicated: Secondary | ICD-10-CM | POA: Diagnosis not present

## 2023-02-15 DIAGNOSIS — B20 Human immunodeficiency virus [HIV] disease: Secondary | ICD-10-CM | POA: Diagnosis not present

## 2023-02-15 DIAGNOSIS — A539 Syphilis, unspecified: Secondary | ICD-10-CM | POA: Diagnosis not present

## 2023-02-15 DIAGNOSIS — B182 Chronic viral hepatitis C: Secondary | ICD-10-CM | POA: Diagnosis not present

## 2023-02-15 DIAGNOSIS — Z1389 Encounter for screening for other disorder: Secondary | ICD-10-CM | POA: Diagnosis not present

## 2023-02-15 DIAGNOSIS — F1120 Opioid dependence, uncomplicated: Secondary | ICD-10-CM | POA: Diagnosis not present
